# Patient Record
Sex: Female | Born: 1965 | Race: White | Hispanic: No | Marital: Married | Smoking: Never smoker
Health system: Southern US, Community
[De-identification: ages and names within clinical notes are randomized; demographics above are authoritative.]

## PROBLEM LIST (undated history)

## (undated) DIAGNOSIS — N200 Calculus of kidney: Secondary | ICD-10-CM

---

## 2012-06-25 ENCOUNTER — Emergency Department (HOSPITAL_BASED_OUTPATIENT_CLINIC_OR_DEPARTMENT_OTHER): Payer: BC Managed Care – PPO

## 2012-06-25 ENCOUNTER — Emergency Department (HOSPITAL_BASED_OUTPATIENT_CLINIC_OR_DEPARTMENT_OTHER)
Admission: EM | Admit: 2012-06-25 | Discharge: 2012-06-25 | Disposition: A | Discharge status: Home / Self Care | Payer: BC Managed Care – PPO | Attending: Emergency Medicine | Admitting: Emergency Medicine

## 2012-06-25 ENCOUNTER — Encounter (HOSPITAL_BASED_OUTPATIENT_CLINIC_OR_DEPARTMENT_OTHER): Payer: Self-pay | Admitting: *Deleted

## 2012-06-25 DIAGNOSIS — X500XXA Overexertion from strenuous movement or load, initial encounter: Secondary | ICD-10-CM | POA: Insufficient documentation

## 2012-06-25 DIAGNOSIS — Y9301 Activity, walking, marching and hiking: Secondary | ICD-10-CM | POA: Insufficient documentation

## 2012-06-25 DIAGNOSIS — Y92009 Unspecified place in unspecified non-institutional (private) residence as the place of occurrence of the external cause: Secondary | ICD-10-CM | POA: Insufficient documentation

## 2012-06-25 DIAGNOSIS — S93409A Sprain of unspecified ligament of unspecified ankle, initial encounter: Secondary | ICD-10-CM | POA: Insufficient documentation

## 2012-06-25 MED ORDER — HYDROCODONE-ACETAMINOPHEN 5-325 MG PO TABS: 2.0000 | ORAL_TABLET | ORAL | Status: DC | PRN

## 2012-06-25 NOTE — ED Provider Notes (Signed)
History     CSN: [id number]  Arrival date & time 06/25/12  1424   First MD Initiated Contact with Patient 06/25/12 1442      Chief Complaint  Patient presents with  . Fall  . Ankle Pain    (Consider location/radiation/quality/duration/timing/severity/associated sxs/prior treatment) HPI Comments: Pt states that she was walking down the stairs and turned on her ankle:pt states that she is having a lot of pain on the outside of the ankle:pt denies previous injury  Patient is a 47 y.o. female presenting with fall and ankle pain. The history is provided by the patient. No language interpreter was used.  Fall The accident occurred 1 to 2 hours ago. She landed on a hard floor. There was no blood loss. Point of impact: right ankle. The pain is moderate. She was ambulatory at the scene. There was no entrapment after the fall. There was no drug use involved in the accident. There was no alcohol use involved in the accident.  Ankle Pain     History reviewed. No pertinent past medical history.  History reviewed. No pertinent past surgical history.  History reviewed. No pertinent family history.  History  Substance Use Topics  . Smoking status: Not on file  . Smokeless tobacco: Not on file  . Alcohol Use: Not on file    OB History    Grav Para Term Preterm Abortions TAB SAB Ect Mult Living                  Review of Systems  Constitutional: Negative.   Respiratory: Negative.   Cardiovascular: Negative.     Allergies  Review of patient's allergies indicates no known allergies.  Home Medications  No current outpatient prescriptions on file.  BP 141/83  Pulse 91  Temp 98.3 F (36.8 C) (Oral)  Resp 18  SpO2 100%  LMP 06/18/2012  Physical Exam  Nursing note and vitals reviewed. Constitutional: She is oriented to person, place, and time. She appears well-developed and well-nourished.  HENT:  Head: Normocephalic and atraumatic.  Eyes: Conjunctivae normal and EOM are  normal.  Cardiovascular: Normal rate and regular rhythm.   Pulmonary/Chest: Effort normal and breath sounds normal.  Musculoskeletal: Normal range of motion.       Swelling noted to the right lateral ankle:pt has full rom  Neurological: She is alert and oriented to person, place, and time.  Skin: Skin is warm and dry.    ED Course  Procedures (including critical care time)  Labs Reviewed - No data to display Dg Ankle Complete Right  06/25/2012  *RADIOLOGY REPORT*  Clinical Data: Twisting injury with lateral pain and swelling.  RIGHT ANKLE - COMPLETE 3+ VIEW  Comparison: None.  The 07/22/2007 study is labeled bilateral but only the left-sided images are available.  Findings: There is minimal lateral malleolar soft tissue swelling. Small calcaneal spur. Base of fifth metatarsal and talar dome intact.  IMPRESSION: Mild lateral malleolar soft tissue swelling. No acute osseous abnormality.   Original Report Authenticated By: Jeronimo Greaves, M.D.      1. Ankle sprain       MDM  Pt placed in aso and crutches:pt is okay to follow up with DR. Pearletha Forge for continued symptoms        Teressa Lower, NP 06/25/12 [phone]

## 2012-06-25 NOTE — ED Notes (Signed)
Pt to triage in w/c reports trip at home, c/o right ankle pain. Denies head injury, loc or any other c/o.

## 2012-06-26 NOTE — ED Provider Notes (Signed)
Medical screening examination/treatment/procedure(s) were performed by non-physician practitioner and as supervising physician I was immediately available for consultation/collaboration.  Joakim Huesman B. Davonta Stroot, MD 06/26/12 0707 

## 2013-07-20 ENCOUNTER — Emergency Department (HOSPITAL_BASED_OUTPATIENT_CLINIC_OR_DEPARTMENT_OTHER): Payer: BC Managed Care – PPO

## 2013-07-20 ENCOUNTER — Emergency Department (HOSPITAL_BASED_OUTPATIENT_CLINIC_OR_DEPARTMENT_OTHER)
Admission: EM | Admit: 2013-07-20 | Discharge: 2013-07-20 | Disposition: A | Discharge status: Home / Self Care | Payer: BC Managed Care – PPO | Attending: Emergency Medicine | Admitting: Emergency Medicine

## 2013-07-20 DIAGNOSIS — Z3202 Encounter for pregnancy test, result negative: Secondary | ICD-10-CM | POA: Insufficient documentation

## 2013-07-20 DIAGNOSIS — R1110 Vomiting, unspecified: Secondary | ICD-10-CM

## 2013-07-20 DIAGNOSIS — R630 Anorexia: Secondary | ICD-10-CM | POA: Insufficient documentation

## 2013-07-20 DIAGNOSIS — R319 Hematuria, unspecified: Secondary | ICD-10-CM

## 2013-07-20 DIAGNOSIS — R112 Nausea with vomiting, unspecified: Secondary | ICD-10-CM | POA: Insufficient documentation

## 2013-07-20 DIAGNOSIS — M545 Low back pain, unspecified: Secondary | ICD-10-CM | POA: Insufficient documentation

## 2013-07-20 DIAGNOSIS — J069 Acute upper respiratory infection, unspecified: Secondary | ICD-10-CM

## 2013-07-20 LAB — URINE MICROSCOPIC-ADD ON

## 2013-07-20 LAB — URINALYSIS, ROUTINE W REFLEX MICROSCOPIC
BILIRUBIN URINE: NEGATIVE
GLUCOSE, UA: NEGATIVE mg/dL
Ketones, ur: 40 mg/dL — AB
Leukocytes, UA: NEGATIVE
Nitrite: NEGATIVE
PROTEIN: NEGATIVE mg/dL
Specific Gravity, Urine: 1.019 (ref 1.005–1.030)
Urobilinogen, UA: 0.2 mg/dL (ref 0.0–1.0)
pH: 5.5 (ref 5.0–8.0)

## 2013-07-20 LAB — BASIC METABOLIC PANEL
BUN: 20 mg/dL (ref 6–23)
CALCIUM: 9.8 mg/dL (ref 8.4–10.5)
CO2: 24 meq/L (ref 19–32)
CREATININE: 0.9 mg/dL (ref 0.50–1.10)
Chloride: 104 mEq/L (ref 96–112)
GFR calc Af Amer: 86 mL/min — ABNORMAL LOW (ref >90)
GFR calc non Af Amer: 74 mL/min — ABNORMAL LOW (ref >90)
GLUCOSE: 124 mg/dL — AB (ref 70–99)
Potassium: 3.6 mEq/L — ABNORMAL LOW (ref 3.7–5.3)
SODIUM: 142 meq/L (ref 137–147)

## 2013-07-20 LAB — PREGNANCY, URINE: PREG TEST UR: NEGATIVE

## 2013-07-20 MED ORDER — SODIUM CHLORIDE 0.9 % IV BOLUS (SEPSIS)
1000.0000 mL | Freq: Once | INTRAVENOUS | Status: AC
  Administered 2013-07-20: 1000 mL via INTRAVENOUS

## 2013-07-20 MED ORDER — ONDANSETRON HCL 4 MG/2ML IJ SOLN
4.0000 mg | Freq: Once | INTRAMUSCULAR | Status: AC
Start: 2013-07-20 — End: 2013-07-20
  Administered 2013-07-20: 4 mg via INTRAVENOUS
  Filled 2013-07-20: qty 2

## 2013-07-20 MED ORDER — ONDANSETRON 4 MG PO TBDP: 4.0000 mg | ORAL_TABLET | Freq: Three times a day (TID) | ORAL | Status: DC | PRN

## 2013-07-20 MED ORDER — HYDROCOD POLST-CHLORPHEN POLST 10-8 MG/5ML PO LQCR: 5.0000 mL | Freq: Two times a day (BID) | ORAL | Status: DC | PRN

## 2013-07-20 NOTE — ED Provider Notes (Signed)
Medical screening examination/treatment/procedure(s) were performed by non-physician practitioner and as supervising physician I was immediately available for consultation/collaboration.   EKG Interpretation None        Gwyneth SproutWhitney Landin Tallon, MD 07/20/13 1530

## 2013-07-20 NOTE — ED Provider Notes (Signed)
CSN: [id number]     Arrival date & time 07/20/13  1210 History   First MD Initiated Contact with Patient 07/20/13 1239     Chief Complaint  Patient presents with  . nausea and vomiting      (Consider location/radiation/quality/duration/timing/severity/associated sxs/prior Treatment) HPI Comments: Pt states that he is having vomiting and uri symptoms for the last couple of days. Has been unable to keep anything down including tylenol. Has had a low grade fever. No abdominal pain. Pt states that she is also have some low back pain  The history is provided by the patient. No language interpreter was used.    No past medical history on file. No past surgical history on file. No family history on file. History  Substance Use Topics  . Smoking status: Not on file  . Smokeless tobacco: Not on file  . Alcohol Use: Not on file   OB History   Grav Para Term Preterm Abortions TAB SAB Ect Mult Living                 Review of Systems  Constitutional: Positive for chills. Negative for fever.  Respiratory: Positive for cough.   Cardiovascular: Negative.       Allergies  Review of patient's allergies indicates no known allergies.  Home Medications   Current Outpatient Rx  Name  Route  Sig  Dispense  Refill  . HYDROcodone-acetaminophen (NORCO/VICODIN) 5-325 MG per tablet   Oral   Take 2 tablets by mouth every 4 (four) hours as needed for pain.   10 tablet   0    BP 125/74  Pulse 118  Temp(Src) 99.6 F (37.6 C) (Oral)  Resp 18  Ht 5' (1.524 m)  Wt 160 lb (72.576 kg)  BMI 31.25 kg/m2  SpO2 98%  LMP 06/18/2013 Physical Exam  Nursing note and vitals reviewed. Constitutional: She is oriented to person, place, and time. She appears well-developed and well-nourished.  HENT:  Right Ear: External ear normal.  Left Ear: External ear normal.  Mouth/Throat: Posterior oropharyngeal erythema present.  Eyes: Conjunctivae and EOM are normal. Pupils are equal, round, and reactive to  light.  Cardiovascular: Normal rate and regular rhythm.   Pulmonary/Chest: Effort normal and breath sounds normal.  Abdominal: Soft. Bowel sounds are normal. There is no tenderness.  Musculoskeletal: Normal range of motion.  Neurological: She is alert and oriented to person, place, and time. Coordination normal.  Skin: Skin is warm and dry.  Psychiatric: She has a normal mood and affect.    ED Course  Procedures (including critical care time) Labs Review Labs Reviewed  BASIC METABOLIC PANEL - Abnormal; Notable for the following:    Potassium 3.6 (*)    Glucose, Bld 124 (*)    GFR calc non Af Amer 74 (*)    GFR calc Af Amer 86 (*)    All other components within normal limits  URINALYSIS, ROUTINE W REFLEX MICROSCOPIC - Abnormal; Notable for the following:    Hgb urine dipstick LARGE (*)    Ketones, ur 40 (*)    All other components within normal limits  PREGNANCY, URINE  URINE MICROSCOPIC-ADD ON   Imaging Review Dg Chest 2 View  07/20/2013   CLINICAL DATA:  Cough and fever  EXAM: CHEST  2 VIEW  COMPARISON:  None.  FINDINGS: Lungs are clear. Heart size and pulmonary vascularity are normal. No adenopathy. No bone lesions.  IMPRESSION: No abnormality noted.   Electronically Signed   By: Chrissie Noa  Margarita GrizzleWoodruff M.D.   On: 07/20/2013 13:33     EKG Interpretation None      MDM   Final diagnoses:  Vomiting  URI (upper respiratory infection)  Hematuria    Pt is feeling better and tolerating po at this time. Discussed hematuria with pt but pt feels like she may be getting ready to start her cycle. No pneumonia noted on x-ray. Will treat cough symptomatically with tussionex    Teressa LowerVrinda Cassia Fein, NP 07/20/13 1510

## 2013-07-20 NOTE — ED Notes (Signed)
Pt has had nausea and vomiting for a couple of days feeling like may be dehydrated

## 2013-07-20 NOTE — Discharge Instructions (Signed)
Cough, Adult  A cough is a reflex that helps clear your throat and airways. It can help heal the body or may be a reaction to an irritated airway. A cough may only last 2 or 3 weeks (acute) or may last more than 8 weeks (chronic).  CAUSES Acute cough:  Viral or bacterial infections. Chronic cough:  Infections.  Allergies.  Asthma.  Post-nasal drip.  Smoking.  Heartburn or acid reflux.  Some medicines.  Chronic lung problems (COPD).  Cancer. SYMPTOMS   Cough.  Fever.  Chest pain.  Increased breathing rate.  High-pitched whistling sound when breathing (wheezing).  Colored mucus that you cough up (sputum). TREATMENT   A bacterial cough may be treated with antibiotic medicine.  A viral cough must run its course and will not respond to antibiotics.  Your caregiver may recommend other treatments if you have a chronic cough. HOME CARE INSTRUCTIONS   Only take over-the-counter or prescription medicines for pain, discomfort, or fever as directed by your caregiver. Use cough suppressants only as directed by your caregiver.  Use a cold steam vaporizer or humidifier in your bedroom or home to help loosen secretions.  Sleep in a semi-upright position if your cough is worse at night.  Rest as needed.  Stop smoking if you smoke. SEEK IMMEDIATE MEDICAL CARE IF:   You have pus in your sputum.  Your cough starts to worsen.  You cannot control your cough with suppressants and are losing sleep.  You begin coughing up blood.  You have difficulty breathing.  You develop pain which is getting worse or is uncontrolled with medicine.  You have a fever. MAKE SURE YOU:   Understand these instructions.  Will watch your condition.  Will get help right away if you are not doing well or get worse. Document Released: 11/02/2010 Document Revised: 07/29/2011 Document Reviewed: 11/02/2010 [facility] Patient Information 2014 [place], [place].  Nausea and Vomiting Nausea  means you feel sick to your stomach. Throwing up (vomiting) is a reflex where stomach contents come out of your mouth. HOME CARE   Take medicine as told by your doctor.  Do not force yourself to eat. However, you do need to drink fluids.  If you feel like eating, eat a normal diet as told by your doctor.  Eat rice, wheat, potatoes, bread, lean meats, yogurt, fruits, and vegetables.  Avoid high-fat foods.  Drink enough fluids to keep your pee (urine) clear or pale yellow.  Ask your doctor how to replace body fluid losses (rehydrate). Signs of body fluid loss (dehydration) include:  Feeling very thirsty.  Dry lips and mouth.  Feeling dizzy.  Dark pee.  Peeing less than normal.  Feeling confused.  Fast breathing or heart rate. GET HELP RIGHT AWAY IF:   You have blood in your throw up.  You have black or bloody poop (stool).  You have a bad headache or stiff neck.  You feel confused.  You have bad belly (abdominal) pain.  You have chest pain or trouble breathing.  You do not pee at least once every 8 hours.  You have cold, clammy skin.  You keep throwing up after 24 to 48 hours.  You have a fever. MAKE SURE YOU:   Understand these instructions.  Will watch your condition.  Will get help right away if you are not doing well or get worse. Document Released: 10/23/2007 Document Revised: 07/29/2011 Document Reviewed: 10/05/2010 [facility] Patient Information 2014 [place], [place].

## 2013-07-20 NOTE — ED Notes (Signed)
Pt is aware of the need of a urine specimen and will go as soon as can go.

## 2014-04-14 ENCOUNTER — Encounter (HOSPITAL_BASED_OUTPATIENT_CLINIC_OR_DEPARTMENT_OTHER): Payer: Self-pay

## 2014-04-14 ENCOUNTER — Emergency Department (HOSPITAL_BASED_OUTPATIENT_CLINIC_OR_DEPARTMENT_OTHER): Payer: BC Managed Care – PPO

## 2014-04-14 ENCOUNTER — Emergency Department (HOSPITAL_BASED_OUTPATIENT_CLINIC_OR_DEPARTMENT_OTHER)
Admission: EM | Admit: 2014-04-14 | Discharge: 2014-04-14 | Disposition: A | Discharge status: Other Acute Inpatient Hospital | Payer: BC Managed Care – PPO | Attending: Emergency Medicine | Admitting: Emergency Medicine

## 2014-04-14 DIAGNOSIS — Z3202 Encounter for pregnancy test, result negative: Secondary | ICD-10-CM | POA: Insufficient documentation

## 2014-04-14 DIAGNOSIS — N200 Calculus of kidney: Secondary | ICD-10-CM

## 2014-04-14 DIAGNOSIS — R109 Unspecified abdominal pain: Secondary | ICD-10-CM

## 2014-04-14 HISTORY — DX: Calculus of kidney: N20.0

## 2014-04-14 LAB — CBC WITH DIFFERENTIAL/PLATELET
Basophils Absolute: 0 10*3/uL (ref 0.0–0.1)
Basophils Relative: 0 % (ref 0–1)
Eosinophils Absolute: 0 10*3/uL (ref 0.0–0.7)
Eosinophils Relative: 0 % (ref 0–5)
HEMATOCRIT: 43.6 % (ref 36.0–46.0)
HEMOGLOBIN: 14.3 g/dL (ref 12.0–15.0)
LYMPHS ABS: 0.9 10*3/uL (ref 0.7–4.0)
LYMPHS PCT: 5 % — AB (ref 12–46)
MCH: 26.4 pg (ref 26.0–34.0)
MCHC: 32.8 g/dL (ref 30.0–36.0)
MCV: 80.6 fL (ref 78.0–100.0)
MONO ABS: 0.7 10*3/uL (ref 0.1–1.0)
MONOS PCT: 3 % (ref 3–12)
NEUTROS ABS: 17.6 10*3/uL — AB (ref 1.7–7.7)
Neutrophils Relative %: 92 % — ABNORMAL HIGH (ref 43–77)
Platelets: 292 10*3/uL (ref 150–400)
RBC: 5.41 MIL/uL — ABNORMAL HIGH (ref 3.87–5.11)
RDW: 14.2 % (ref 11.5–15.5)
WBC: 19.2 10*3/uL — AB (ref 4.0–10.5)

## 2014-04-14 LAB — BASIC METABOLIC PANEL
ANION GAP: 14 (ref 5–15)
BUN: 21 mg/dL (ref 6–23)
CHLORIDE: 101 meq/L (ref 96–112)
CO2: 24 meq/L (ref 19–32)
CREATININE: 0.7 mg/dL (ref 0.50–1.10)
Calcium: 10.1 mg/dL (ref 8.4–10.5)
GFR calc Af Amer: >90 mL/min (ref >90)
GFR calc non Af Amer: >90 mL/min (ref >90)
Glucose, Bld: 151 mg/dL — ABNORMAL HIGH (ref 70–99)
Potassium: 4.3 mEq/L (ref 3.7–5.3)
Sodium: 139 mEq/L (ref 137–147)

## 2014-04-14 LAB — URINALYSIS, ROUTINE W REFLEX MICROSCOPIC
BILIRUBIN URINE: NEGATIVE
Glucose, UA: NEGATIVE mg/dL
NITRITE: NEGATIVE
Protein, ur: 30 mg/dL — AB
SPECIFIC GRAVITY, URINE: 1.023 (ref 1.005–1.030)
UROBILINOGEN UA: 0.2 mg/dL (ref 0.0–1.0)
pH: 6.5 (ref 5.0–8.0)

## 2014-04-14 LAB — URINE MICROSCOPIC-ADD ON

## 2014-04-14 LAB — PREGNANCY, URINE: Preg Test, Ur: NEGATIVE

## 2014-04-14 MED ORDER — SODIUM CHLORIDE 0.9 % IV BOLUS (SEPSIS)
1000.0000 mL | Freq: Once | INTRAVENOUS | Status: AC
  Administered 2014-04-14: 1000 mL via INTRAVENOUS

## 2014-04-14 MED ORDER — MORPHINE SULFATE 4 MG/ML IJ SOLN
4.0000 mg | Freq: Once | INTRAMUSCULAR | Status: AC
  Administered 2014-04-14: 4 mg via INTRAVENOUS
  Filled 2014-04-14: qty 1

## 2014-04-14 MED ORDER — ONDANSETRON HCL 4 MG/2ML IJ SOLN
4.0000 mg | Freq: Once | INTRAMUSCULAR | Status: AC
  Administered 2014-04-14: 4 mg via INTRAVENOUS
  Filled 2014-04-14: qty 2

## 2014-04-14 MED ORDER — CEFTRIAXONE SODIUM 1 G IJ SOLR
INTRAMUSCULAR | Status: AC
  Filled 2014-04-14: qty 10

## 2014-04-14 MED ORDER — DEXTROSE 5 % IV SOLN
1.0000 g | Freq: Once | INTRAVENOUS | Status: AC
  Administered 2014-04-14: 1 g via INTRAVENOUS

## 2014-04-14 MED ORDER — CEFTRIAXONE SODIUM 1 G IJ SOLR
INTRAMUSCULAR | Status: AC
Start: 2014-04-14 — End: 2014-04-14
  Filled 2014-04-14: qty 10

## 2014-04-14 NOTE — ED Notes (Signed)
[facility]

## 2014-04-14 NOTE — ED Notes (Signed)
Patient here with left groin pain x 1 day, reports nausea and vomiting with same. Reports hx of kidney stones in last few months

## 2014-04-14 NOTE — ED Provider Notes (Signed)
CSN: [id number]     Arrival date & time 04/14/14  1201 History   First MD Initiated Contact with Patient 04/14/14 1355     Chief Complaint  Patient presents with  . groin pain    (Consider location/radiation/quality/duration/timing/severity/associated sxs/prior Treatment) HPI  Etta QuillDavina Laidler  is a 48 yo presenting with hematuria two days ago. Then 1 day ago she noticed left flank and back pain and nausea and vomiting.  She took pain and nausea medicine at home which helped ease the symptoms.  This morning she woke up and the pain was much worse followed by several episodes of vomiting and did not relieve with her home meds.  She does have a history of kidney stones, last imaged 8 months ago at a different medical center.  She endorses flank pain radiating to groin but denies any fevers or chills or dysuria.     Past Medical History  Diagnosis Date  . Kidney stones    History reviewed. No pertinent past surgical history. No family history on file. History  Substance Use Topics  . Smoking status: Never Smoker   . Smokeless tobacco: Not on file  . Alcohol Use: Not on file   OB History    No data available     Review of Systems  Constitutional: Negative for fever and chills.  HENT: Negative for sore throat.   Eyes: Negative for visual disturbance.  Respiratory: Negative for cough and shortness of breath.   Cardiovascular: Negative for chest pain and leg swelling.  Gastrointestinal: Positive for nausea, vomiting and abdominal distention. Negative for diarrhea.  Genitourinary: Positive for flank pain. Negative for dysuria.  Musculoskeletal: Negative for myalgias.  Skin: Negative for rash.  Neurological: Negative for weakness, numbness and headaches.    Allergies  Review of patient's allergies indicates no known allergies.  Home Medications   Prior to Admission medications   Not on File   BP 146/94 mmHg  Pulse 111  Temp(Src) 98.3 F (36.8 C)  Resp 20  Wt 168 lb  (76.204 kg)  SpO2 100%  LMP 03/31/2014 Physical Exam  Constitutional: She appears well-developed and well-nourished. No distress.  HENT:  Head: Normocephalic and atraumatic.  Mouth/Throat: Oropharynx is clear and moist. No oropharyngeal exudate.  Eyes: Conjunctivae are normal.  Neck: Neck supple. No thyromegaly present.  Cardiovascular: Normal rate, regular rhythm and intact distal pulses.   Pulmonary/Chest: Effort normal and breath sounds normal. No respiratory distress. She has no wheezes. She has no rales. She exhibits no tenderness.  Abdominal: Soft. She exhibits no distension and no mass. There is tenderness. There is CVA tenderness. There is no rigidity, no rebound, no guarding, no tenderness at McBurney's point and negative Murphy's sign.    Musculoskeletal: She exhibits no tenderness.  Lymphadenopathy:    She has no cervical adenopathy.  Neurological: She is alert.  Skin: Skin is warm and dry. No rash noted. She is not diaphoretic.  Psychiatric: She has a normal mood and affect.  Nursing note and vitals reviewed.   ED Course  Procedures (including critical care time) Labs Review Labs Reviewed  URINALYSIS, ROUTINE W REFLEX MICROSCOPIC - Abnormal; Notable for the following:    Color, Urine RED (*)    APPearance TURBID (*)    Hgb urine dipstick LARGE (*)    Ketones, ur >80 (*)    Protein, ur 30 (*)    Leukocytes, UA SMALL (*)    All other components within normal limits  PREGNANCY, URINE  URINE MICROSCOPIC-ADD  ON    Imaging Review Ct Renal Stone Study  04/14/2014   CLINICAL DATA:  Nausea and vomiting with left-sided pain for 1 day. Hematuria  EXAM: CT ABDOMEN AND PELVIS WITHOUT CONTRAST  TECHNIQUE: Multidetector CT imaging of the abdomen and pelvis was performed following the standard protocol without oral or intravenous contrast material administration.  COMPARISON:  None.  FINDINGS: There is mild anterior basilar atelectatic change bilaterally. Lung bases are  otherwise clear.  No focal liver lesions are identified on this noncontrast enhanced study. Gallbladder wall is not appreciably thickened. There is no biliary duct dilatation.  Spleen, pancreas, and adrenals appear normal.  There is a 6 x 4 mm calculus in the upper pole right kidney. There is also a 1 mm calculus more medially in the upper pole of the right kidney. There is a 1 mm calculus in the mid right kidney as well as a second 1 mm calculus more posteriorly located in the mid to lower pole right kidney. There is no mass or hydronephrosis on the right. There is no right-sided ureteral calculus. On the left, there is mild renal edema and moderate hydronephrosis. There are several 1-2 mm calculi in the upper to mid left kidney. There is a 4 x 2 mm calculus in the posterior mid kidney. In the lower pole region, there is a 1 mm calculus, a 2 mm calculus, and a 5 mm calculus. There is a 6 x 6 mm calculus in the proximal left ureter at the level of L3-4. No other ureteral calculi are identified.  In the pelvis, there is a mass in the left ovary measuring 5.3 x 3.4 x 4.1 cm. This mass has attenuation values slightly higher than is expected with a the simple cyst. There are septations in this lesion is well. No other pelvic mass is identified. There is no pelvic fluid collection. The urinary bladder is midline with normal wall thickness. Appendix is not seen. There is no periappendiceal region inflammation. The terminal ileum appears normal. There are sigmoid diverticula without diverticulitis.  There is a small ventral hernia containing only fat. There is no bowel obstruction. No free air or portal venous air.  There is no ascites, adenopathy, or abscess in the abdomen or pelvis. There is no demonstrable abdominal aortic aneurysm. There are no blastic or lytic bone lesions.  IMPRESSION: 6 mm calculus in the proximal left ureter causing moderate hydronephrosis on the left. There are multiple nonobstructing calculi in  each kidney, more on the left than on the right.  Somewhat complex but predominantly cystic left adnexal mass measuring 5.3 x 3.4 x 4.1 cm. Major differential considerations include complex cyst versus potential cystadenoma. This finding may warrant correlation with pelvic ultrasound to more precisely assess the internal architecture of this lesion.  No bowel obstruction. No abscess. No periappendiceal region inflammation.  Sigmoid diverticula without diverticulitis. Small ventral hernia containing only fat.   Electronically Signed   By: Bretta BangWilliam  Woodruff M.D.   On: 04/14/2014 14:27     EKG Interpretation None      MDM   Final diagnoses:  Flank pain  Renal stone   48 yo female with flank pain and hematuria.   CBC, BMP, UA, CT stone study, NS bolus, pain and nausea meds. Symptoms improved.  CT shows 6 mm stone in proximal left ureter.  CBC: WBC: 19.2 and Ab neutrophils: 17.6.  UA: small leukocytes, and hgb and ketones.  Discussed case with Dr. Donnald GarrePfeiffer.  IV abx given.  Consulted pt's on call urologist, recommended ED to ED transfer to [facility] for stent placement.  Discussed with Dr. Zachery Dauer [facility] ED accepting physician), pt accepted and transferred arranged.    The patient appears reasonably stabilized for transfer considering the current resources, flow, and capabilities available in the ED at this time, and I doubt any further screening and/or treatment in the ED prior to transfer.   Filed Vitals:   04/14/14 1211 04/14/14 1550 04/14/14 1647 04/14/14 1701  BP: 146/94 120/73 139/83 139/83  Pulse: 111 103 98 98  Temp: 98.3 F (36.8 C) 98.5 F (36.9 C) 98.2 F (36.8 C) 98.2 F (36.8 C)  TempSrc:  Oral Oral   Resp: 20 20 14 14   Weight: 168 lb (76.204 kg)     SpO2: 100% 100% 100% 100%   Meds given in ED:  Medications  morphine 4 MG/ML injection 4 mg (4 mg Intravenous Given 04/14/14 1409)  ondansetron (ZOFRAN) injection 4 mg (4 mg Intravenous Given 04/14/14 1410)    sodium chloride 0.9 % bolus 1,000 mL (0 mLs Intravenous Stopped 04/14/14 1613)  sodium chloride 0.9 % bolus 1,000 mL (0 mLs Intravenous Stopped 04/14/14 1700)  cefTRIAXone (ROCEPHIN) 1 g in dextrose 5 % 50 mL IVPB (0 g Intravenous Stopped 04/14/14 1646)  cefTRIAXone (ROCEPHIN) 1 G injection (  Duplicate 04/14/14 1612)  cefTRIAXone (ROCEPHIN) 1 G injection (  Duplicate 04/14/14 1615)  morphine 4 MG/ML injection 4 mg (4 mg Intravenous Given 04/14/14 1657)    There are no discharge medications for this patient.      Harle Battiest, NP 04/16/14 0041  Arby Barrette, MD 04/16/14 [phone]

## 2017-03-10 ENCOUNTER — Other Ambulatory Visit: Payer: Self-pay | Admitting: Physician Assistant

## 2017-03-10 DIAGNOSIS — Z139 Encounter for screening, unspecified: Secondary | ICD-10-CM

## 2017-03-28 ENCOUNTER — Ambulatory Visit
Admission: RE | Admit: 2017-03-28 | Discharge: 2017-03-28 | Disposition: A | Discharge status: Home / Self Care | Payer: BLUE CROSS/BLUE SHIELD | Source: Ambulatory Visit | Attending: Physician Assistant | Admitting: Physician Assistant

## 2017-03-28 DIAGNOSIS — Z139 Encounter for screening, unspecified: Secondary | ICD-10-CM

## 2017-04-01 ENCOUNTER — Other Ambulatory Visit: Payer: Self-pay | Admitting: Physician Assistant

## 2017-04-01 DIAGNOSIS — R928 Other abnormal and inconclusive findings on diagnostic imaging of breast: Secondary | ICD-10-CM

## 2017-04-07 ENCOUNTER — Other Ambulatory Visit: Payer: Self-pay | Admitting: Physician Assistant

## 2017-04-07 ENCOUNTER — Ambulatory Visit
Admission: RE | Admit: 2017-04-07 | Discharge: 2017-04-07 | Disposition: A | Discharge status: Home / Self Care | Payer: BLUE CROSS/BLUE SHIELD | Source: Ambulatory Visit | Attending: Physician Assistant | Admitting: Physician Assistant

## 2017-04-07 DIAGNOSIS — R928 Other abnormal and inconclusive findings on diagnostic imaging of breast: Secondary | ICD-10-CM

## 2017-04-07 DIAGNOSIS — N6310 Unspecified lump in the right breast, unspecified quadrant: Secondary | ICD-10-CM

## 2017-04-14 ENCOUNTER — Ambulatory Visit
Admission: RE | Admit: 2017-04-14 | Discharge: 2017-04-14 | Disposition: A | Discharge status: Home / Self Care | Payer: BLUE CROSS/BLUE SHIELD | Source: Ambulatory Visit | Attending: Physician Assistant | Admitting: Physician Assistant

## 2017-04-14 ENCOUNTER — Other Ambulatory Visit: Payer: Self-pay | Admitting: Physician Assistant

## 2017-04-14 DIAGNOSIS — N6310 Unspecified lump in the right breast, unspecified quadrant: Secondary | ICD-10-CM

## 2017-04-15 HISTORY — PX: BREAST BIOPSY: SHX20

## 2018-05-27 ENCOUNTER — Other Ambulatory Visit: Payer: Self-pay | Admitting: Physician Assistant

## 2018-05-27 DIAGNOSIS — Z1231 Encounter for screening mammogram for malignant neoplasm of breast: Secondary | ICD-10-CM

## 2018-06-24 ENCOUNTER — Ambulatory Visit
Admission: RE | Admit: 2018-06-24 | Discharge: 2018-06-24 | Disposition: A | Discharge status: Home / Self Care | Payer: BLUE CROSS/BLUE SHIELD | Source: Ambulatory Visit | Attending: Physician Assistant | Admitting: Physician Assistant

## 2018-06-24 DIAGNOSIS — Z1231 Encounter for screening mammogram for malignant neoplasm of breast: Secondary | ICD-10-CM

## 2021-02-08 ENCOUNTER — Other Ambulatory Visit: Payer: Self-pay | Admitting: Physician Assistant

## 2021-02-08 DIAGNOSIS — Z1231 Encounter for screening mammogram for malignant neoplasm of breast: Secondary | ICD-10-CM

## 2021-03-08 ENCOUNTER — Other Ambulatory Visit: Payer: Self-pay

## 2021-03-08 ENCOUNTER — Ambulatory Visit
Admission: RE | Admit: 2021-03-08 | Discharge: 2021-03-08 | Disposition: A | Discharge status: Home / Self Care | Payer: BC Managed Care – PPO | Source: Ambulatory Visit | Attending: Physician Assistant | Admitting: Physician Assistant

## 2021-03-08 DIAGNOSIS — Z1231 Encounter for screening mammogram for malignant neoplasm of breast: Secondary | ICD-10-CM

## 2022-03-07 IMAGING — MG MM DIGITAL SCREENING BILAT W/ TOMO AND CAD
8 series · 8 of 24 positions shown · non-contrast
Comparison: Previous exam(s).

CLINICAL DATA: Screening.

EXAM:
DIGITAL SCREENING BILATERAL MAMMOGRAM WITH TOMOSYNTHESIS AND CAD
TECHNIQUE: Bilateral screening digital craniocaudal and mediolateral oblique
mammograms were obtained. Bilateral screening digital breast
tomosynthesis was performed. The images were evaluated with
computer-aided detection.

[L CC synth-2D]
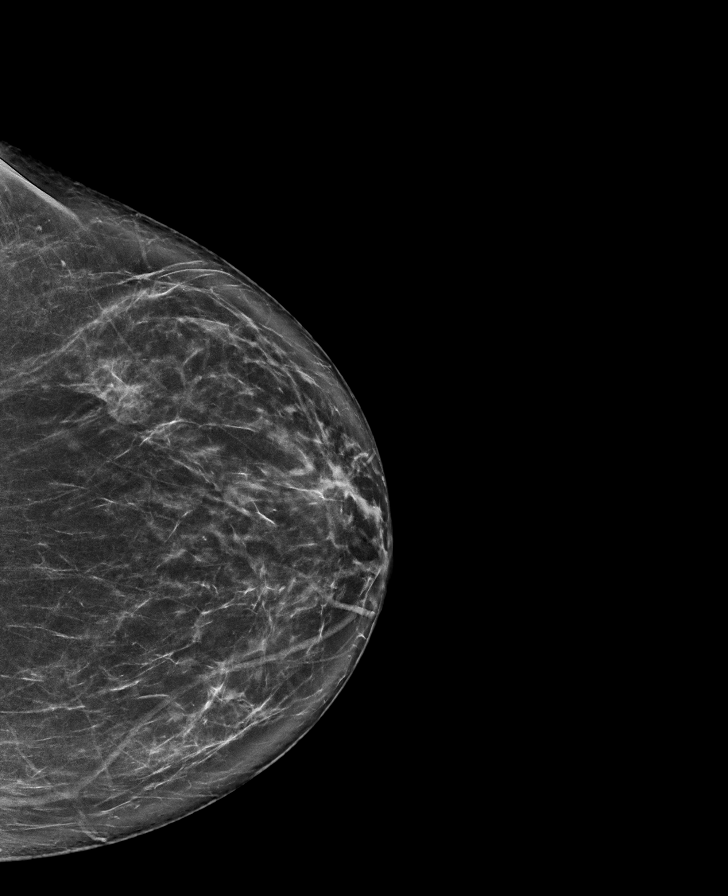

[R CC synth-2D]
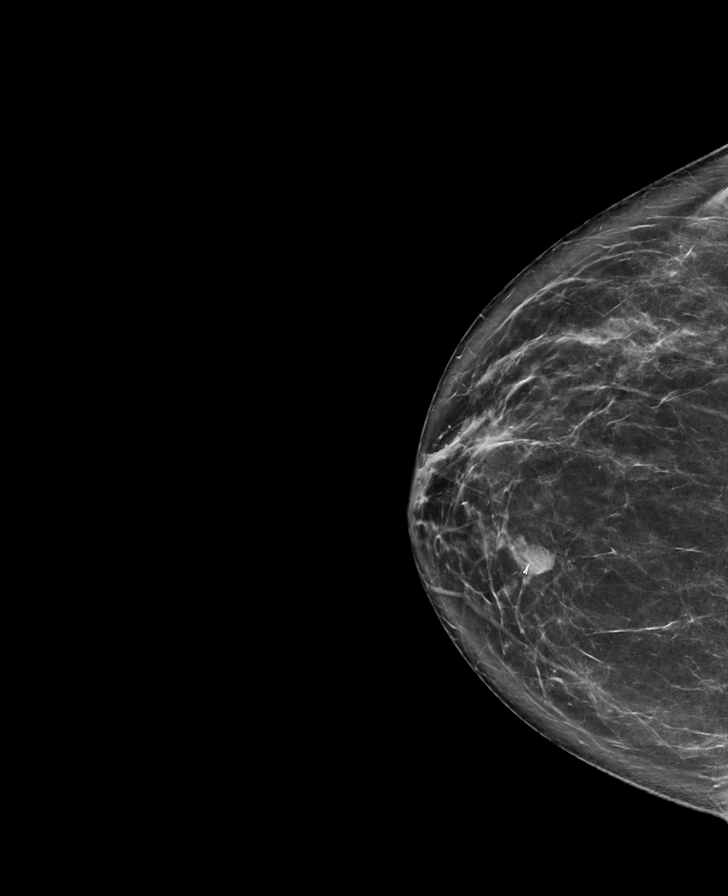

[R MLO synth-2D]
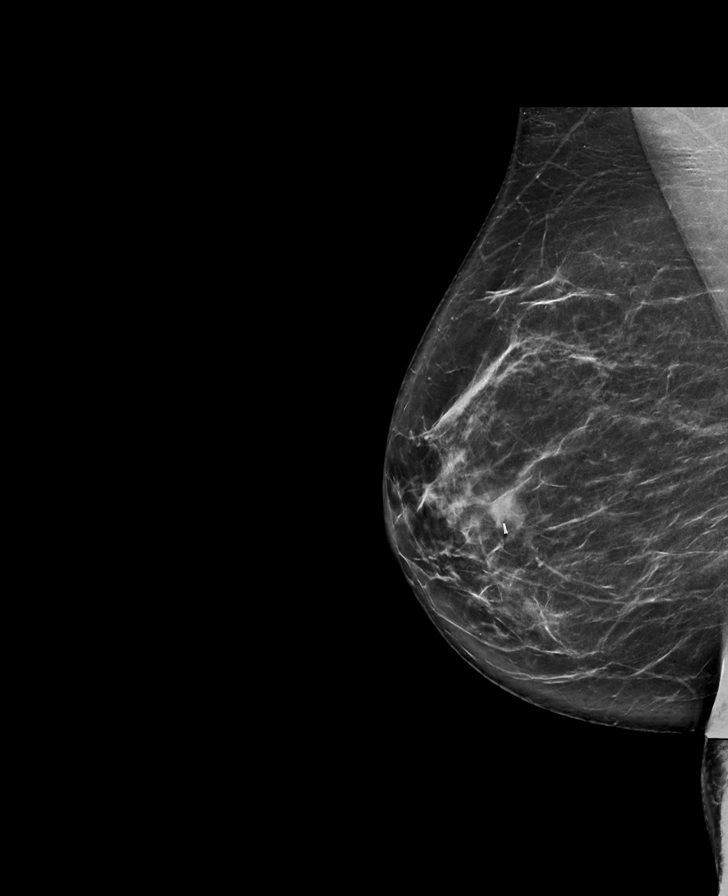

[L MLO synth-2D]
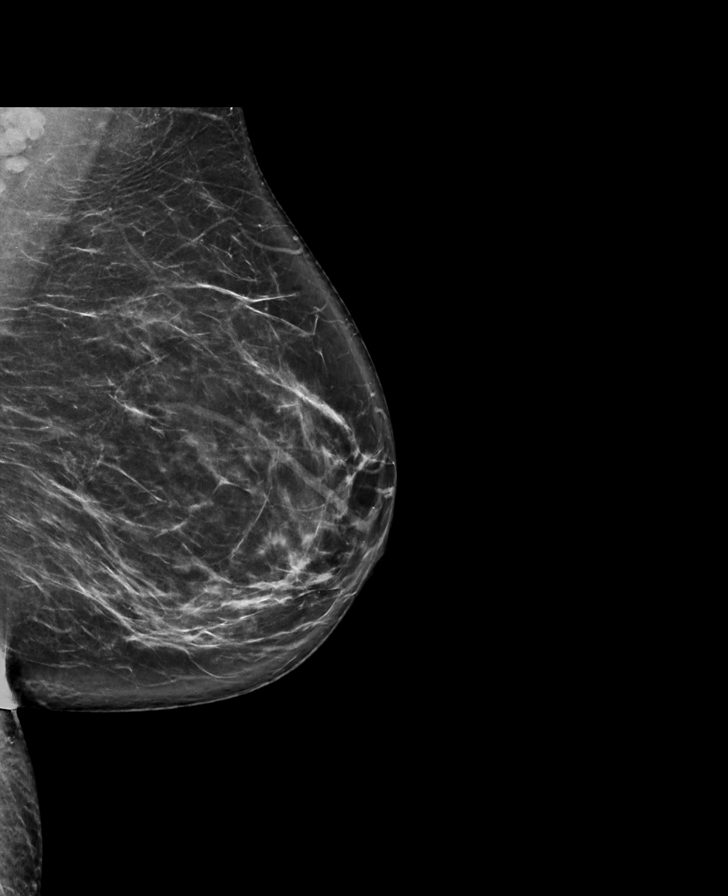

[L CC tomo · tomo slice 41/81.0]
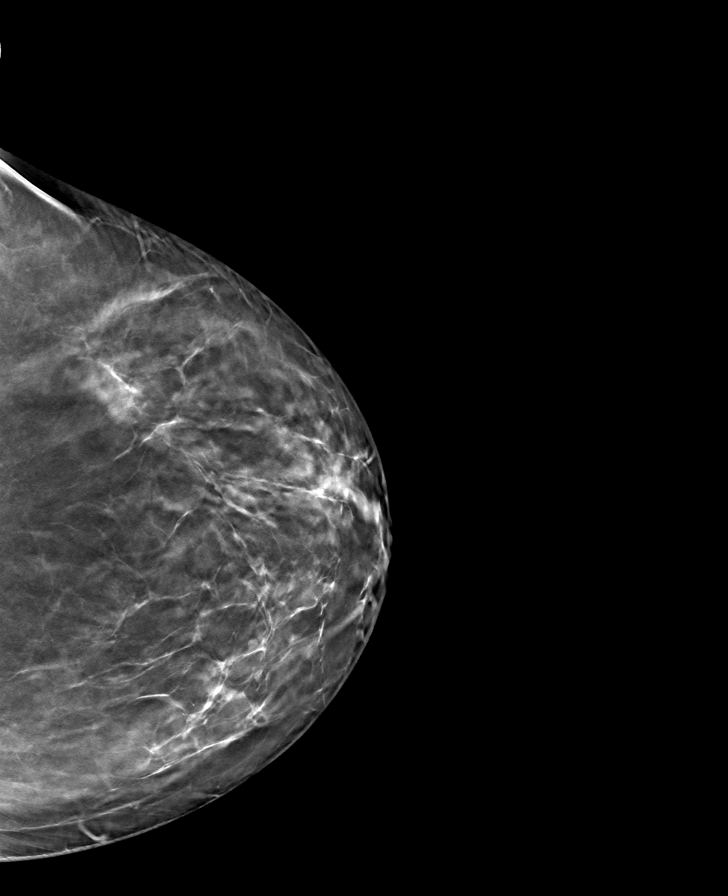

[R MLO tomo · tomo slice 43/84.0]
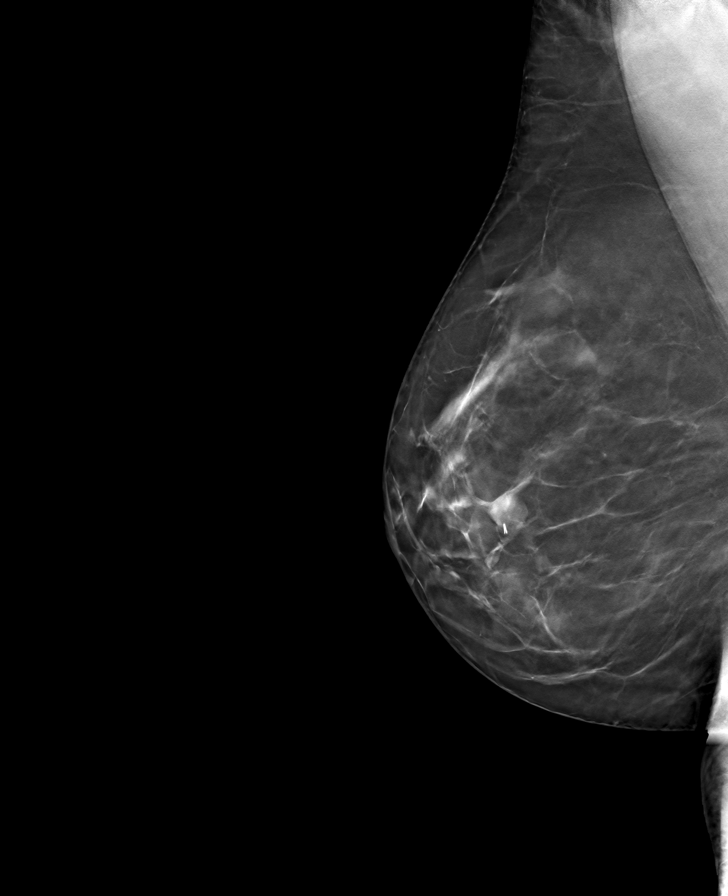

[R CC tomo · tomo slice 42/83.0]
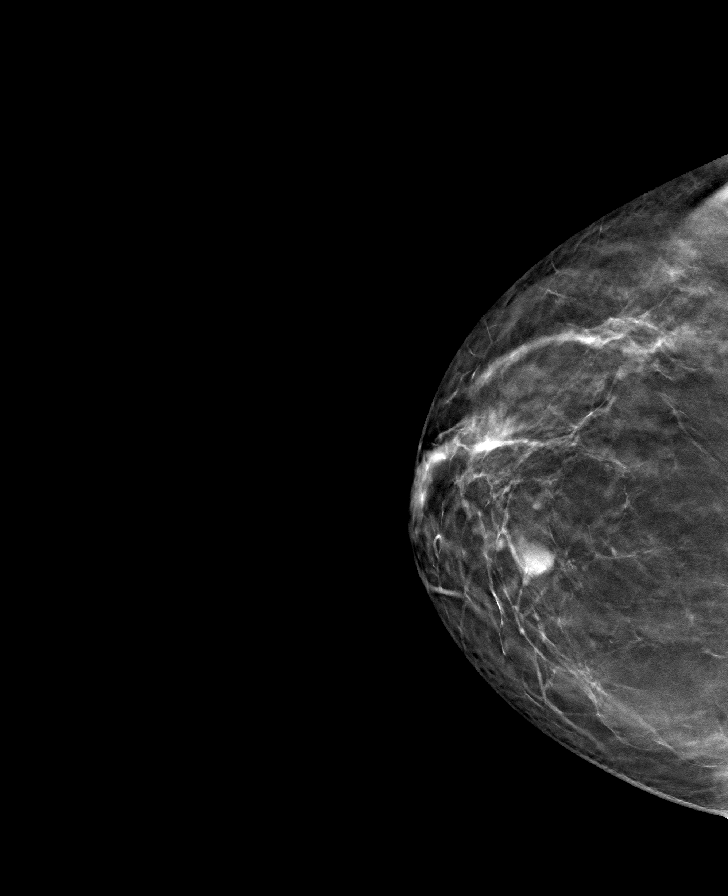

[L MLO tomo · tomo slice 43/85.0]
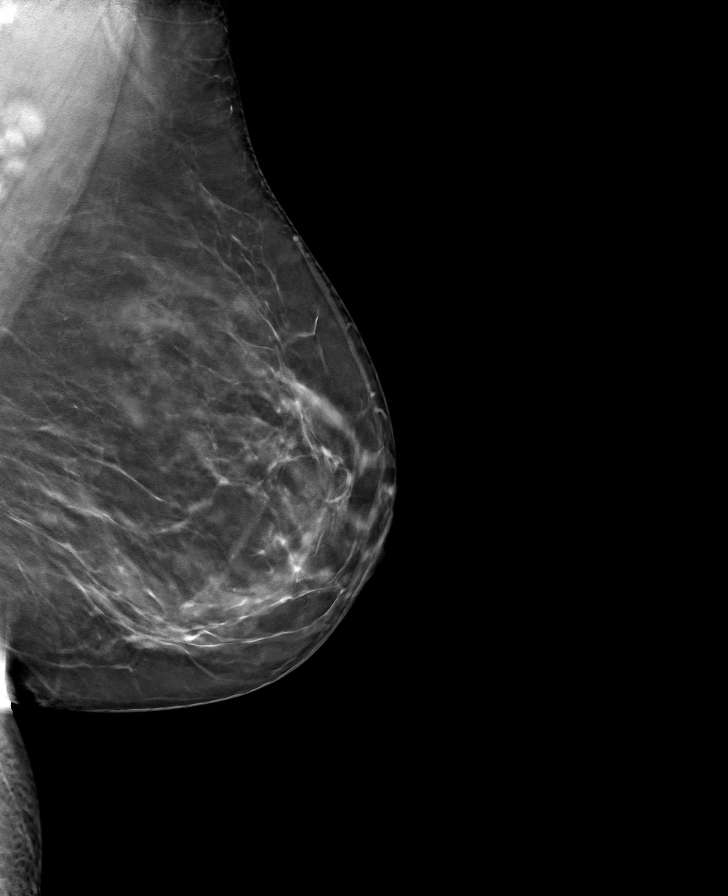

[8 of 24 positions shown; findings below may reference images not displayed]

ACR Breast Density Category b: There are scattered areas of
fibroglandular density.
FINDINGS: There are no findings suspicious for malignancy.
IMPRESSION: No mammographic evidence of malignancy. A result letter of this
screening mammogram will be mailed directly to the patient.

RECOMMENDATION:
Screening mammogram in one year. (Code:51-O-LD2)

BI-RADS CATEGORY  1: Negative.

## 2022-04-17 ENCOUNTER — Other Ambulatory Visit: Payer: Self-pay

## 2022-04-17 DIAGNOSIS — Z1231 Encounter for screening mammogram for malignant neoplasm of breast: Secondary | ICD-10-CM

## 2022-04-18 ENCOUNTER — Ambulatory Visit
Admission: RE | Admit: 2022-04-18 | Discharge: 2022-04-18 | Disposition: A | Discharge status: Home / Self Care | Payer: BC Managed Care – PPO | Source: Ambulatory Visit | Attending: Physician Assistant | Admitting: Physician Assistant

## 2022-04-18 DIAGNOSIS — Z1231 Encounter for screening mammogram for malignant neoplasm of breast: Secondary | ICD-10-CM

## 2024-03-10 ENCOUNTER — Other Ambulatory Visit: Payer: Self-pay | Admitting: Physician Assistant

## 2024-03-10 DIAGNOSIS — Z1231 Encounter for screening mammogram for malignant neoplasm of breast: Secondary | ICD-10-CM

## 2024-04-19 ENCOUNTER — Ambulatory Visit
Admission: RE | Admit: 2024-04-19 | Discharge: 2024-04-19 | Disposition: A | Source: Ambulatory Visit | Attending: Physician Assistant

## 2024-04-19 DIAGNOSIS — Z1231 Encounter for screening mammogram for malignant neoplasm of breast: Secondary | ICD-10-CM
